# Patient Record
Sex: Male | Born: 1995 | Race: Black or African American | Hispanic: No | Marital: Single | Smoking: Current every day smoker
Health system: Southern US, Community
[De-identification: ages and names within clinical notes are randomized; demographics above are authoritative.]

---

## 2020-04-27 ENCOUNTER — Emergency Department (HOSPITAL_COMMUNITY)
Admission: EM | Admit: 2020-04-27 | Discharge: 2020-04-28 | Disposition: A | Discharge status: Home / Self Care | Payer: 59 | Attending: Emergency Medicine | Admitting: Emergency Medicine

## 2020-04-27 ENCOUNTER — Other Ambulatory Visit: Payer: Self-pay

## 2020-04-27 ENCOUNTER — Encounter (HOSPITAL_COMMUNITY): Payer: Self-pay | Admitting: *Deleted

## 2020-04-27 DIAGNOSIS — F17200 Nicotine dependence, unspecified, uncomplicated: Secondary | ICD-10-CM | POA: Insufficient documentation

## 2020-04-27 DIAGNOSIS — R519 Headache, unspecified: Secondary | ICD-10-CM | POA: Insufficient documentation

## 2020-04-27 NOTE — ED Triage Notes (Signed)
Pt reports +etoh use on Saturday night. Having a severe headache x 24 hours. Denies n/v. Denies hx of migraines. Minimal relief with ibuprofen.

## 2020-04-28 MED ORDER — BUTALBITAL-APAP-CAFFEINE 50-325-40 MG PO TABS: 1.0000 | ORAL_TABLET | Freq: Four times a day (QID) | ORAL | 0 refills | Status: AC | PRN

## 2020-04-28 NOTE — Discharge Instructions (Signed)
Alternate between tylenol and ibuprofen as needed for headache.  You can take fioricet as needed if headache is not improved.

## 2020-04-28 NOTE — ED Provider Notes (Signed)
[facility] EMERGENCY DEPARTMENT Provider Note   CSN: [id number] Arrival date & time: 04/27/20  2029     History Chief Complaint  Patient presents with  . Headache    Juan Wolfe is a 25 y.o. male.  The history is provided by the patient. No language interpreter was used.  Headache    25 year old male significant history of migraine, alcohol use and tobacco use presenting complaint of headache.  Patient report having an ongoing headache for the past 2 to 3 days.  Headache is described as a sharp throbbing sensation to his forehead that radiates towards the back of his head.  Headache is been waxing waning initially improved with ibuprofen but then returns.  He was having difficulty sleeping.  He mention headache did improve while waiting in the ED but have not fully resolved.  He does not complain of any fever, runny nose sneezing coughing sore throat loss of taste or smell chest pain or shortness of breath.  He denies any focal numbness or focal weakness.  No rash.  No visual changes.  No history of migraine.  No recent head injury.  He admits to drinking alcohol several days ago prior to the onset of the headache but states usually he can sleep it off and headache will go away.  He has been fully vaccinated for COVID-19.  History reviewed. No pertinent past medical history.  There are no problems to display for this patient.   History reviewed. No pertinent surgical history.     History reviewed. No pertinent family history.  Social History   Tobacco Use  . Smoking status: Current Every Day Smoker  Substance Use Topics  . Alcohol use: Yes    Home Medications Prior to Admission medications   Not on File    Allergies    Patient has no known allergies.  Review of Systems   Review of Systems  Neurological: Positive for headaches.  All other systems reviewed and are negative.   Physical Exam Updated Vital Signs BP 130/63   Pulse 75    Temp 98.6 F (37 C)   Resp 18   SpO2 100%   Physical Exam Vitals and nursing note reviewed.  Constitutional:      General: He is not in acute distress.    Appearance: He is well-developed and well-nourished.  HENT:     Head: Normocephalic and atraumatic.     Mouth/Throat:     Mouth: Mucous membranes are moist.  Eyes:     General: No visual field deficit.    Extraocular Movements: Extraocular movements intact.     Conjunctiva/sclera: Conjunctivae normal.     Pupils: Pupils are equal, round, and reactive to light.  Cardiovascular:     Rate and Rhythm: Normal rate.  Pulmonary:     Effort: Pulmonary effort is normal.  Musculoskeletal:        General: Normal range of motion.     Cervical back: Normal range of motion and neck supple. No rigidity.  Skin:    Findings: No rash.  Neurological:     Mental Status: He is alert and oriented to person, place, and time.     GCS: GCS eye subscore is 4. GCS verbal subscore is 5. GCS motor subscore is 6.     Cranial Nerves: No cranial nerve deficit, dysarthria or facial asymmetry.     Sensory: No sensory deficit.     Coordination: Romberg sign negative.  Psychiatric:  Mood and Affect: Mood and affect and mood normal.     ED Results / Procedures / Treatments   Labs (all labs ordered are listed, but only abnormal results are displayed) Labs Reviewed - No data to display  EKG None  Radiology No results found.  Procedures Procedures   Medications Ordered in ED Medications - No data to display  ED Course  I have reviewed the triage vital signs and the nursing notes.  Pertinent labs & imaging results that were available during my care of the patient were reviewed by me and considered in my medical decision making (see chart for details).    MDM Rules/Calculators/A&P                          BP 109/61   Pulse 75   Temp 98.6 F (37 C)   Resp 18   SpO2 100%   Final Clinical Impression(s) / ED Diagnoses Final  diagnoses:  Bad headache    Rx / DC Orders ED Discharge Orders         Ordered    butalbital-acetaminophen-caffeine (FIORICET) 50-325-40 MG tablet  Every 6 hours PRN        04/28/20 0908         8:37 AM Patient here with symptoms suggestive of tension headache.  Doubt migraine.  No red flags.  Patient overall well-appearing.  Will prescribe medication to help with headache provide strict return precaution.   Fayrene Helper, PA-C 04/28/20 0911    Melene Plan, DO 04/28/20 [phone]

## 2020-04-30 ENCOUNTER — Emergency Department (HOSPITAL_COMMUNITY)
Admission: EM | Admit: 2020-04-30 | Discharge: 2020-05-01 | Disposition: A | Discharge status: Home / Self Care | Payer: 59 | Attending: Emergency Medicine | Admitting: Emergency Medicine

## 2020-04-30 ENCOUNTER — Other Ambulatory Visit: Payer: Self-pay

## 2020-04-30 ENCOUNTER — Encounter (HOSPITAL_COMMUNITY): Payer: Self-pay | Admitting: Emergency Medicine

## 2020-04-30 DIAGNOSIS — Z20822 Contact with and (suspected) exposure to covid-19: Secondary | ICD-10-CM | POA: Diagnosis not present

## 2020-04-30 DIAGNOSIS — R110 Nausea: Secondary | ICD-10-CM | POA: Diagnosis not present

## 2020-04-30 DIAGNOSIS — R519 Headache, unspecified: Secondary | ICD-10-CM | POA: Insufficient documentation

## 2020-04-30 DIAGNOSIS — F17200 Nicotine dependence, unspecified, uncomplicated: Secondary | ICD-10-CM | POA: Diagnosis not present

## 2020-04-30 LAB — BASIC METABOLIC PANEL
Anion gap: 9 (ref 5–15)
BUN: 13 mg/dL (ref 6–20)
CO2: 25 mmol/L (ref 22–32)
Calcium: 9.2 mg/dL (ref 8.9–10.3)
Chloride: 108 mmol/L (ref 98–111)
Creatinine, Ser: 0.88 mg/dL (ref 0.61–1.24)
GFR, Estimated: >60 mL/min (ref >60)
Glucose, Bld: 90 mg/dL (ref 70–99)
Potassium: 4.3 mmol/L (ref 3.5–5.1)
Sodium: 142 mmol/L (ref 135–145)

## 2020-04-30 LAB — CBC WITH DIFFERENTIAL/PLATELET
Abs Immature Granulocytes: 0.02 10*3/uL (ref 0.00–0.07)
Basophils Absolute: 0 10*3/uL (ref 0.0–0.1)
Basophils Relative: 1 %
Eosinophils Absolute: 0.1 10*3/uL (ref 0.0–0.5)
Eosinophils Relative: 1 %
HCT: 46 % (ref 39.0–52.0)
Hemoglobin: 15.9 g/dL (ref 13.0–17.0)
Immature Granulocytes: 0 %
Lymphocytes Relative: 40 %
Lymphs Abs: 2.3 10*3/uL (ref 0.7–4.0)
MCH: 32.6 pg (ref 26.0–34.0)
MCHC: 34.6 g/dL (ref 30.0–36.0)
MCV: 94.3 fL (ref 80.0–100.0)
Monocytes Absolute: 0.5 10*3/uL (ref 0.1–1.0)
Monocytes Relative: 10 %
Neutro Abs: 2.7 10*3/uL (ref 1.7–7.7)
Neutrophils Relative %: 48 %
Platelets: 220 10*3/uL (ref 150–400)
RBC: 4.88 MIL/uL (ref 4.22–5.81)
RDW: 11.8 % (ref 11.5–15.5)
WBC: 5.6 10*3/uL (ref 4.0–10.5)
nRBC: 0 % (ref 0.0–0.2)

## 2020-04-30 NOTE — ED Triage Notes (Signed)
Patient reports persistent headache this week , no emesis or photophobia , denies head injury , unrelieved by prescription pain medication( Fioricet) .

## 2020-05-01 ENCOUNTER — Emergency Department (HOSPITAL_COMMUNITY): Payer: 59

## 2020-05-01 LAB — SARS CORONAVIRUS 2 (TAT 6-24 HRS): SARS Coronavirus 2: NEGATIVE

## 2020-05-01 MED ORDER — KETOROLAC TROMETHAMINE 60 MG/2ML IM SOLN
60.0000 mg | Freq: Once | INTRAMUSCULAR | Status: AC
  Administered 2020-05-01: 60 mg via INTRAMUSCULAR
  Filled 2020-05-01: qty 2

## 2020-05-01 NOTE — ED Provider Notes (Signed)
[facility] EMERGENCY DEPARTMENT Provider Note   CSN: [id number] Arrival date & time: 04/30/20  2020     History Chief Complaint  Patient presents with  . Headache    Juan Wolfe is a 25 y.o. male with history of ADHD presents to the ED for evaluation of persistent headache.  This began on January 30.  He woke up with it.  Initially was a slight discomfort and it progressed throughout the day.  Described as slight but sometimes 8/10.  It is located in the back of his head but it goes all over.  He had a headache when he checked into the ED approximately 10 hours ago.  He took 1000 mg of Tylenol in the waiting room and is now headache free.  He came to the ED on 1/31 for evaluation and headache.  He was discharged with Fioricet.  He has taken 1 pill so far.  States it did resolve the headache but he felt drowsy 48 hours afterwards so he did not take it again.  Has had intermittent nausea and sensitivity to smells.  The night before the headache began he had a significant amount of alcohol and smoked marijuana.  Denies any falls or head trauma.  States he drinks only occasionally may be every other weekend but when he drinks he drinks a significant amount.  His cousin told him that she had Covid and the only symptom that she had was a really bad headache so he is asking for Covid test.  He has already had Covid.  He denies Covid symptoms including congestion, sore throat, cough, vomiting, diarrhea, abdominal pain, chest pain or shortness of breath.  Denies photophobia, phonophobia, neck pain or stiffness.  No visual changes, one-sided weakness or numbness.  No fever, rash.  No personal family history of migraines.  No personal or family history of aneurysms.  HPI     History reviewed. No pertinent past medical history.  There are no problems to display for this patient.   History reviewed. No pertinent surgical history.     No family history on file.  Social  History   Tobacco Use  . Smoking status: Current Every Day Smoker  Substance Use Topics  . Alcohol use: Yes    Home Medications Prior to Admission medications   Medication Sig Start Date End Date Taking? Authorizing Provider  butalbital-acetaminophen-caffeine (FIORICET) 50-325-40 MG tablet Take 1 tablet by mouth every 6 (six) hours as needed for headache. 04/28/20 04/28/21  Fayrene Helper, PA-C    Allergies    Patient has no known allergies.  Review of Systems   Review of Systems  Gastrointestinal: Positive for nausea.  Neurological: Positive for headaches.       Sensitivity to smells   All other systems reviewed and are negative.   Physical Exam Updated Vital Signs BP 110/75   Pulse 63   Temp 97.8 F (36.6 C) (Oral)   Resp 19   Ht 5\' 7"  (1.702 m)   Wt 65 kg   SpO2 100%   BMI 22.44 kg/m   Physical Exam Vitals and nursing note reviewed.  Constitutional:      General: He is not in acute distress.    Appearance: He is well-developed and well-nourished.     Comments: NAD.   HENT:     Head: Normocephalic and atraumatic.     Comments: No tenderness over temporal arteries. No obvious signs of facial or scalp trauma     Right  Ear: External ear normal.     Left Ear: External ear normal.     Nose: Nose normal.  Eyes:     General: No scleral icterus.    Extraocular Movements: EOM normal.     Conjunctiva/sclera: Conjunctivae normal.  Neck:     Comments: No cervical midline or paraspinal muscle tenderness. Full ROM of neck without pain, rigidity. No meningismus  Cardiovascular:     Rate and Rhythm: Normal rate and regular rhythm.     Pulses: Intact distal pulses.     Heart sounds: Normal heart sounds. No murmur heard.   Pulmonary:     Effort: Pulmonary effort is normal.     Breath sounds: Normal breath sounds. No wheezing.  Musculoskeletal:        General: No deformity. Normal range of motion.     Cervical back: Normal range of motion and neck supple.  Skin:     General: Skin is warm and dry.     Capillary Refill: Capillary refill takes less than 2 seconds.  Neurological:     Mental Status: He is alert and oriented to person, place, and time.     Comments:   MENTAL STATUS Patient is awake, alert, oriented to person, place, year, and situation. Patient is able to give a clear and coherent history.  Speech is fluent and clear without dysarthria or aphasia.  No signs of neglect.  CRANIAL NERVES I not tested II visual fields full bilaterally. PERRL.   III, IV, VI EOMs intact without ptosis V sensation to light touch intact in all 3 divisions of trigeminal nerve bilaterally  VII facial movements symmetric bilaterally VIII hearing intact to voice/conversation  IX, X no uvula deviation, symmetric rise of soft palate/uvula XI 5/5 SCM and trapezius strength bilaterally  XII tongue protrusion midline, symmetric L/R movements  MOTOR Strength 5/5 in upper/lower extremities.   Sensation to light touch intact in face, upper/lower extremities. No pronator drift. No leg drop.  CEREBELLAR No ataxia with finger to nose. Gait steady  Psychiatric:        Mood and Affect: Mood and affect normal.        Behavior: Behavior normal.        Thought Content: Thought content normal.        Judgment: Judgment normal.     ED Results / Procedures / Treatments   Labs (all labs ordered are listed, but only abnormal results are displayed) Labs Reviewed  SARS CORONAVIRUS 2 (TAT 6-24 HRS)  CBC WITH DIFFERENTIAL/PLATELET  BASIC METABOLIC PANEL    EKG None  Radiology CT Head Wo Contrast  Result Date: 05/01/2020 CLINICAL DATA:  Persistent headache, migraine for 3 days with nausea EXAM: CT HEAD WITHOUT CONTRAST TECHNIQUE: Contiguous axial images were obtained from the base of the skull through the vertex without intravenous contrast. Sagittal and coronal MPR images reconstructed from axial data set. COMPARISON:  None FINDINGS: Brain: Normal ventricular  morphology. No midline shift or mass effect. Normal appearance of brain parenchyma. No intracranial hemorrhage, mass lesion, evidence of acute infarction, or extra-axial fluid collection. Vascular: No hyperdense vessels Skull: Intact Sinuses/Orbits: Clear Other: N/A IMPRESSION: Normal exam. Electronically Signed   By: Ulyses Southward M.D.   On: 05/01/2020 08:35    Procedures Procedures   Medications Ordered in ED Medications  ketorolac (TORADOL) injection 60 mg (has no administration in time range)    ED Course  I have reviewed the triage vital signs and the nursing notes.  Pertinent labs &  imaging results that were available during my care of the patient were reviewed by me and considered in my medical decision making (see chart for details).    MDM Rules/Calculators/A&P                          25 year old male presents for recurrent occipital headache. This began the day after he had significant amount of alcohol and marijuana.  EMR, triage or nurse notes reviewed  I have reviewed ED visit on 1/31 for same complaint. He was discharged with Fioricet.  Patient has taken 1 dose of Fioricet and states it helped his headache but was worried because he was drowsy for 48 hours and did not take it again. He is asking for Covid test since his cousin had similar symptoms when she had Covid. Also concerned about something in his brain that could be causing his headache.  Exam is benign, no neuro deficits. No neck rigidity. Normal vital signs.  Initial lab work ordered in triage. I have added CT head given patient concern, Covid swab.  ER work-up personally visualized and interpreted by me  Labs-unremarkable  Imaging-CT head is nonacute without mass-effect lesions, hemorrhage  Patient was initially headache free after taking home dose of Tylenol in the waiting room. Prior to discharge he reported headache was starting to come back.  Medicines ordered-Toradol IM.  At this time high  suspicion is for tension type headache, uncomplicated migraine. Patient is without high risk features of headache like thunderclap headache, stroke symptoms, seizure, fever, meningismus, oral anticoagulation. Doubt meningitis, brain mass, stroke, dissection given his age, exam, symptoms.  We will discharge with Fioricet/Tylenol. Recommended rest, oral fluids. He was instructed to check MyChart for COVID results. It is possible headache is from Covid as well.  Will give neurology contact for follow-up for persistent or recurrent headaches. Return precautions given. Patient is comfortable with this plan.  Final Clinical Impression(s) / ED Diagnoses Final diagnoses:  Recurrent headache    Rx / DC Orders ED Discharge Orders    None       Liberty Handy, PA-C 05/01/20 3614    Wynetta Fines, MD 05/04/20 [phone]

## 2020-05-01 NOTE — ED Notes (Signed)
Patient transported to CT 

## 2020-05-01 NOTE — Discharge Instructions (Addendum)
You were seen in the emergency department for recurrent headaches  Lab work and CT of the head were done today.  These are normal.  Your test for Covid as well, look up the results of your test on MyChart in the next 24 hours  You can take 500 to 1000 mg of acetaminophen every 6-8 hours or as needed.  You can also take your prescribed Fioricet if acetaminophen is not helping.  Caution in frequent and prolonged uses of these medicines as these can also cause rebound medication headaches.  Rest, stay hydrated.  Avoid common triggers of headaches including marijuana, alcohol  The cause of your headache is unclear.  It could be from tension, migraines, etc.  Please contact neurology clinic for reevaluation and further work-up of her headaches if they do not eventually go away.  Return to the ED for severe, constant headache, stroke symptoms, fever, neck stiffness

## 2020-05-01 NOTE — ED Notes (Signed)
PT states headache returning.  PA notified and will give pain meds once head CT results.

## 2022-05-05 IMAGING — CT CT HEAD W/O CM
4 series · 17 of 47 positions shown, 19 images · non-contrast
Comparison: None

CLINICAL DATA: Persistent headache, migraine for 3 days with nausea

EXAM:
CT HEAD WITHOUT CONTRAST
TECHNIQUE: Contiguous axial images were obtained from the base of the skull
through the vertex without intravenous contrast. Sagittal and
coronal MPR images reconstructed from axial data set.

[Series 3: head without · axial · non-contrast · 0.40mm/px · z∈[-89,+31]mm · 7 of 32 slices shown, 9 images]
[im 4/32  brain]
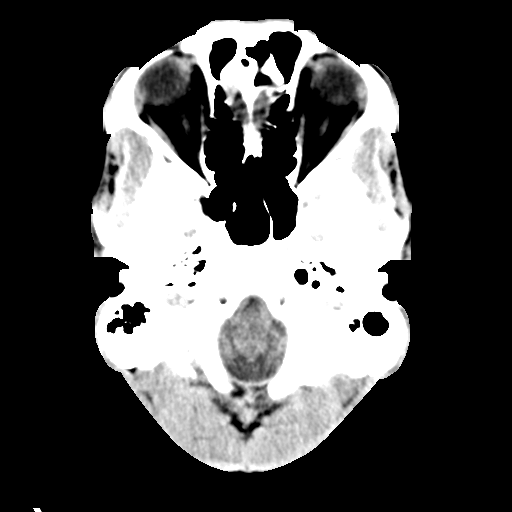
[im 4/32  bone]
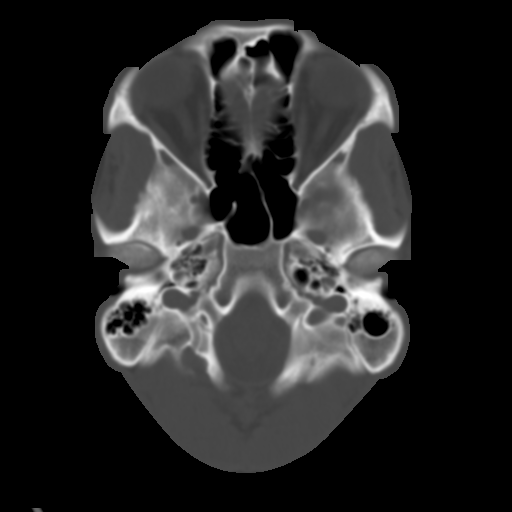
[im 8/32  brain]
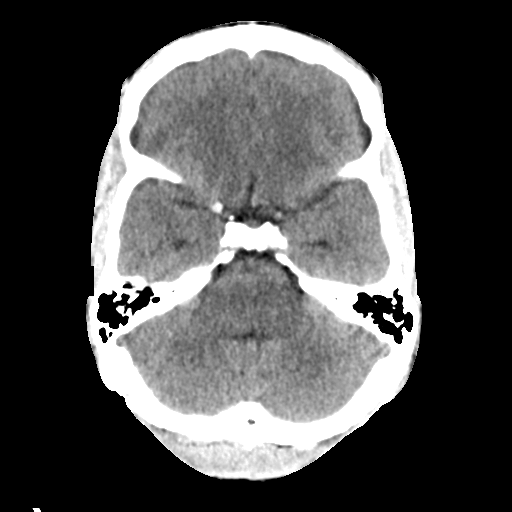
[im 12/32  brain]
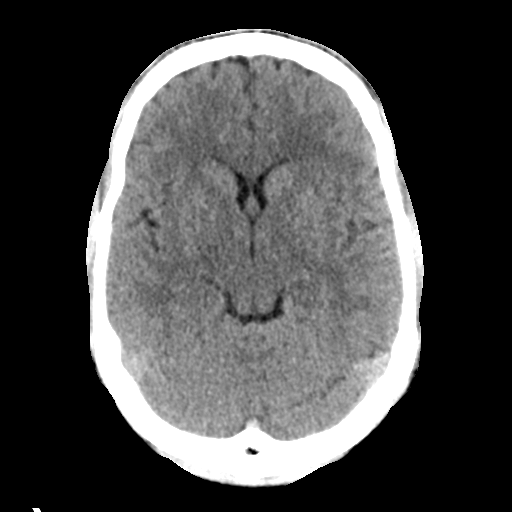
[im 16/32  brain]
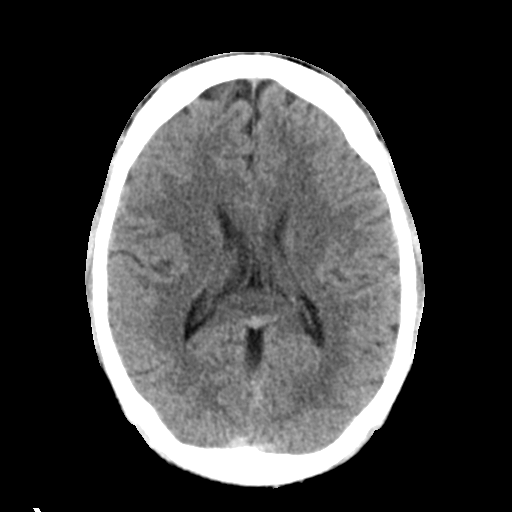
[im 20/32  brain]
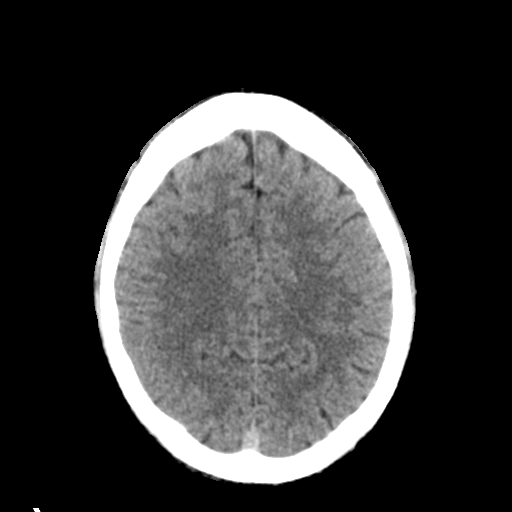
[im 20/32  bone]
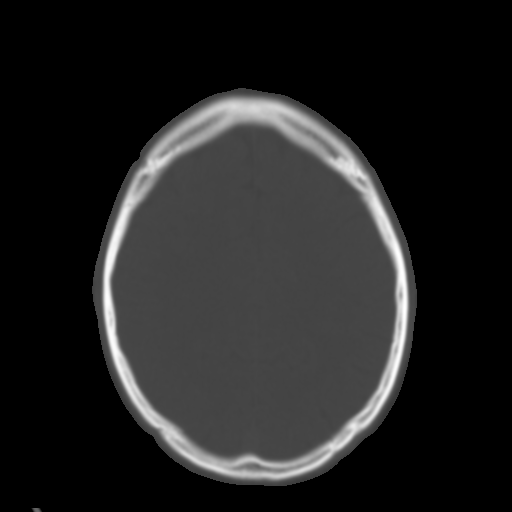
[im 24/32  brain]
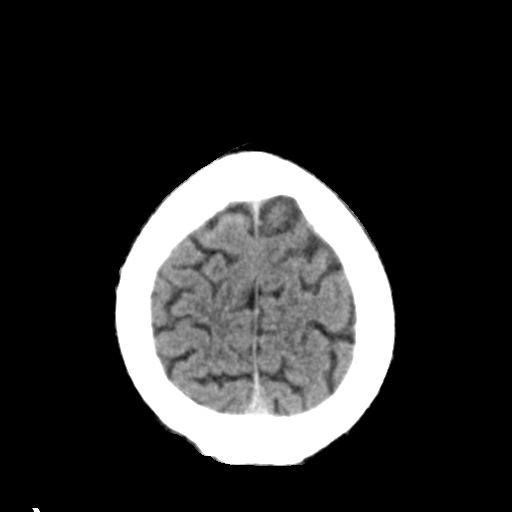
[im 28/32  brain]
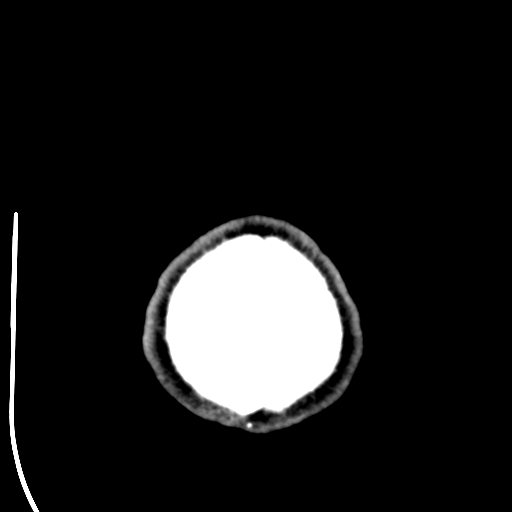

[Series 4: head bone · axial · 0.40mm/px · z∈[-90,-34]mm · 4 of 79 slices shown]
[im 8/79  bone]
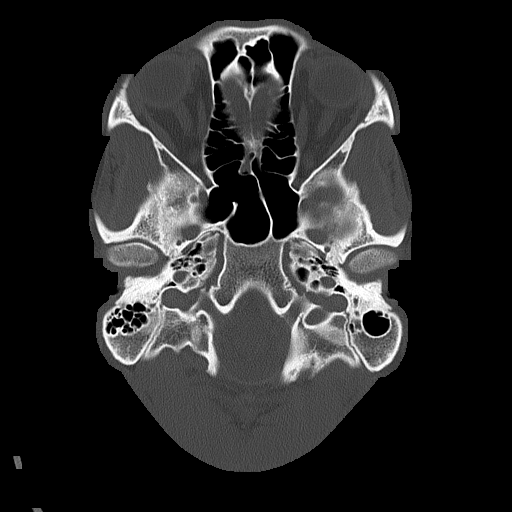
[im 16/79  bone]
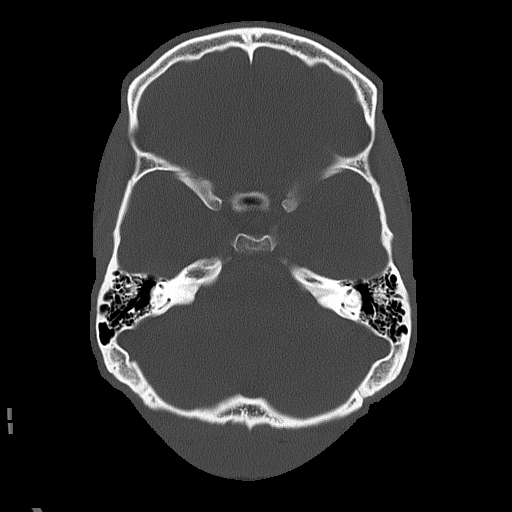
[im 24/79  bone]
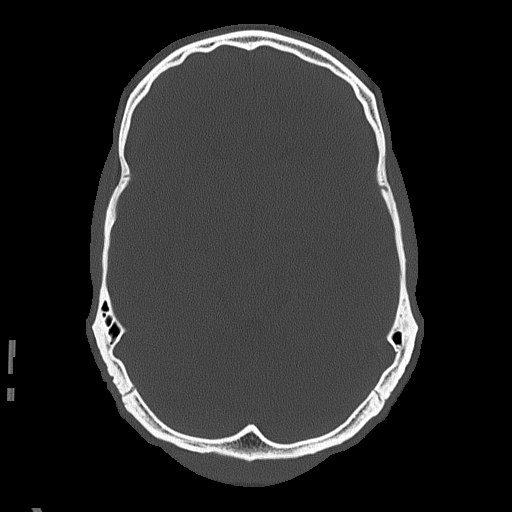
[im 36/79  bone]
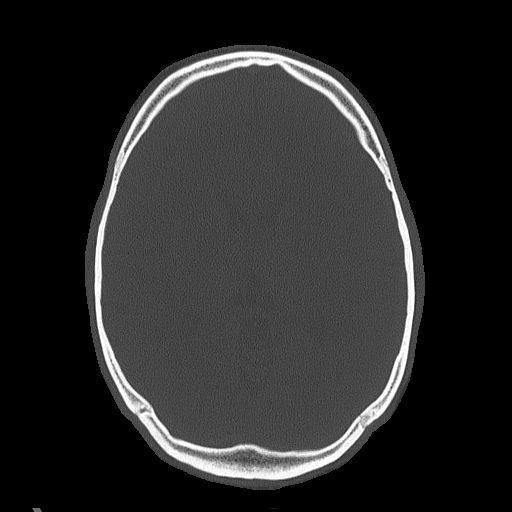

[Series 5: head without cor · coronal · non-contrast · 0.31mm/px · 3 of 69 slices shown]
[im 23/69  brain]
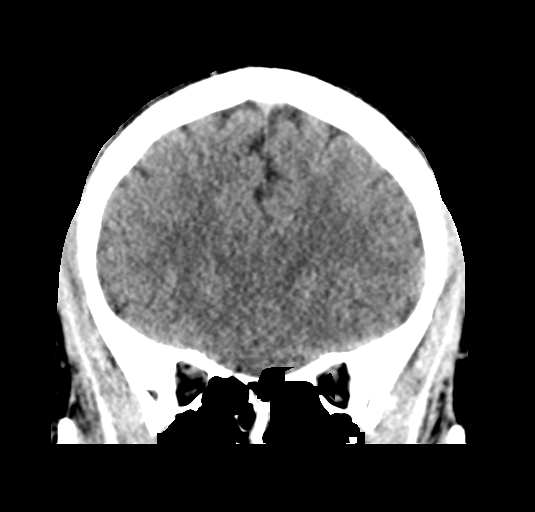
[im 31/69  brain]
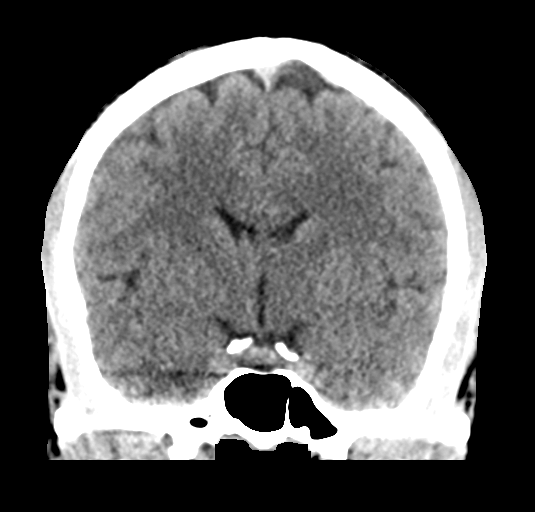
[im 38/69  brain]
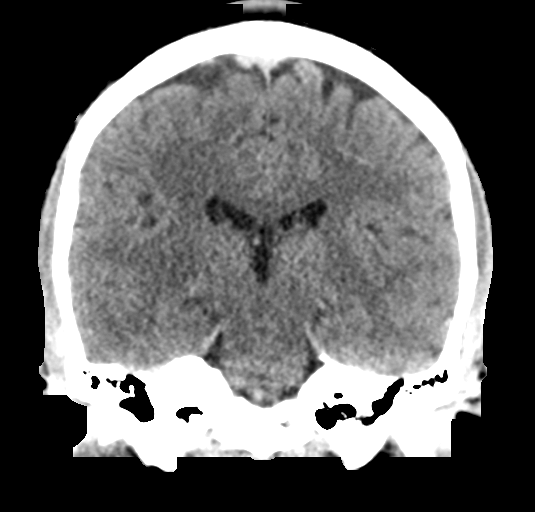

[Series 6: head without sag · sagittal · non-contrast · 0.31mm/px · 3 of 55 slices shown]
[im 19/55  brain]
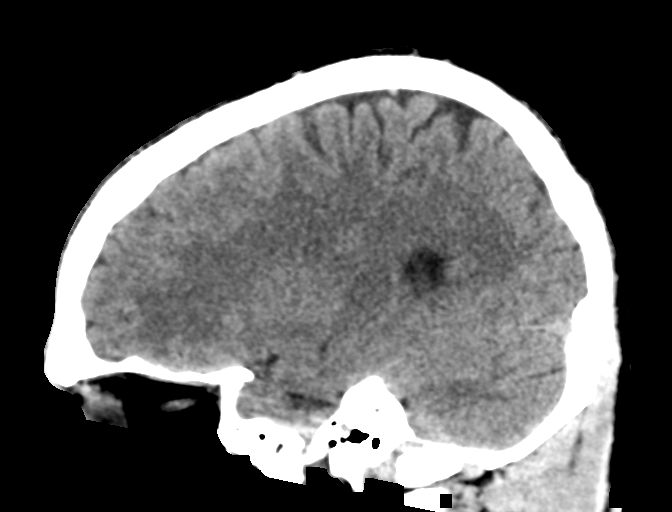
[im 28/55  brain]
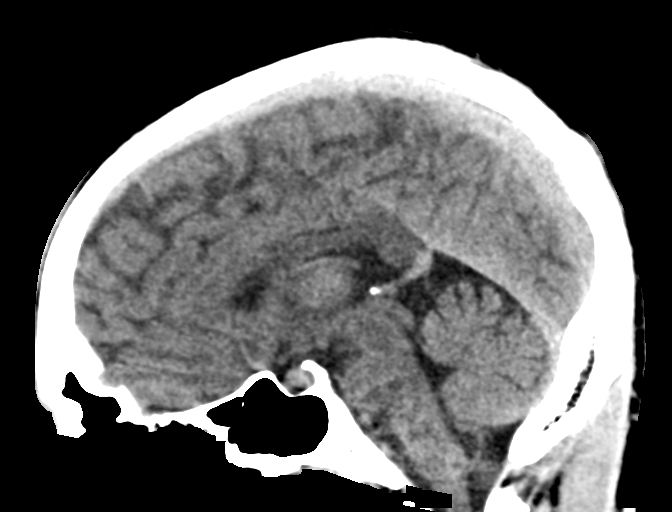
[im 37/55  brain]
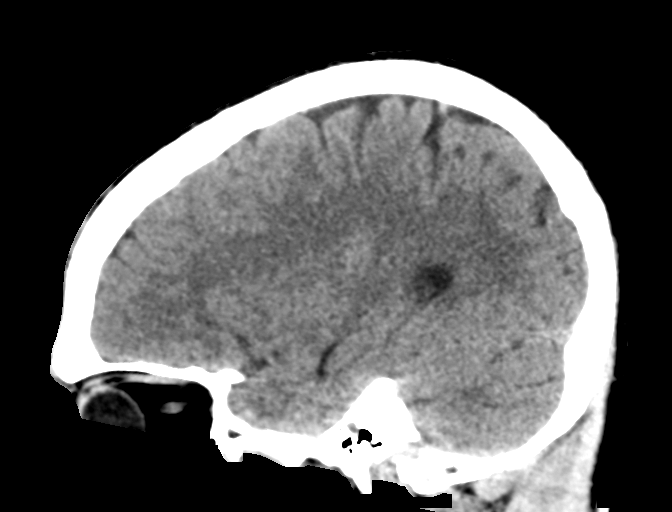

[17 of 47 positions shown; findings below may reference images not displayed]

FINDINGS: Brain: Normal ventricular morphology. No midline shift or mass
effect. Normal appearance of brain parenchyma. No intracranial
hemorrhage, mass lesion, evidence of acute infarction, or
extra-axial fluid collection.

Vascular: No hyperdense vessels

Skull: Intact

Sinuses/Orbits: Clear

Other: N/A
IMPRESSION: Normal exam.
